# Patient Record
Sex: Female | Born: 1970 | Race: White | Hispanic: No | Marital: Married | Smoking: Never smoker
Health system: Southern US, Community
[De-identification: ages and names within clinical notes are randomized; demographics above are authoritative.]

## PROBLEM LIST (undated history)

## (undated) DIAGNOSIS — M722 Plantar fascial fibromatosis: Secondary | ICD-10-CM

## (undated) DIAGNOSIS — N83209 Unspecified ovarian cyst, unspecified side: Secondary | ICD-10-CM

## (undated) HISTORY — PX: ENDOMETRIAL ABLATION: SHX621

---

## 1999-09-15 ENCOUNTER — Other Ambulatory Visit: Admission: RE | Admit: 1999-09-15 | Discharge: 1999-09-15 | Payer: Self-pay | Admitting: Obstetrics and Gynecology

## 2000-10-11 ENCOUNTER — Other Ambulatory Visit: Admission: RE | Admit: 2000-10-11 | Discharge: 2000-10-11 | Payer: Self-pay | Admitting: Obstetrics and Gynecology

## 2001-02-14 ENCOUNTER — Encounter: Payer: Self-pay | Admitting: Emergency Medicine

## 2001-02-14 ENCOUNTER — Emergency Department (HOSPITAL_COMMUNITY): Admission: EM | Admit: 2001-02-14 | Discharge: 2001-02-14 | Payer: Self-pay | Admitting: Emergency Medicine

## 2001-03-29 ENCOUNTER — Other Ambulatory Visit: Admission: RE | Admit: 2001-03-29 | Discharge: 2001-03-29 | Payer: Self-pay | Admitting: Obstetrics and Gynecology

## 2001-09-24 ENCOUNTER — Inpatient Hospital Stay (HOSPITAL_COMMUNITY): Admission: AD | Admit: 2001-09-24 | Discharge: 2001-09-24 | Payer: Self-pay | Admitting: Obstetrics & Gynecology

## 2001-10-19 ENCOUNTER — Inpatient Hospital Stay (HOSPITAL_COMMUNITY): Admission: AD | Admit: 2001-10-19 | Discharge: 2001-10-22 | Payer: Self-pay | Admitting: Obstetrics and Gynecology

## 2001-11-29 ENCOUNTER — Other Ambulatory Visit: Admission: RE | Admit: 2001-11-29 | Discharge: 2001-11-29 | Payer: Self-pay | Admitting: Obstetrics and Gynecology

## 2003-08-18 ENCOUNTER — Other Ambulatory Visit: Admission: RE | Admit: 2003-08-18 | Discharge: 2003-08-18 | Payer: Self-pay | Admitting: Obstetrics and Gynecology

## 2004-11-01 ENCOUNTER — Other Ambulatory Visit: Admission: RE | Admit: 2004-11-01 | Discharge: 2004-11-01 | Payer: Self-pay | Admitting: *Deleted

## 2004-11-23 ENCOUNTER — Ambulatory Visit (HOSPITAL_COMMUNITY): Admission: RE | Admit: 2004-11-23 | Discharge: 2004-11-23 | Payer: Self-pay | Admitting: Obstetrics and Gynecology

## 2006-05-02 ENCOUNTER — Ambulatory Visit (HOSPITAL_COMMUNITY): Admission: RE | Admit: 2006-05-02 | Discharge: 2006-05-02 | Payer: Self-pay | Admitting: Family Medicine

## 2006-05-22 ENCOUNTER — Ambulatory Visit (HOSPITAL_COMMUNITY): Admission: RE | Admit: 2006-05-22 | Discharge: 2006-05-22 | Payer: Self-pay | Admitting: Family Medicine

## 2007-12-29 IMAGING — CT CT ABDOMEN W/O CM
2 of 3 series · 17 of 46 positions shown, 19 images · IV contrast (agent unspecified)
Comparison: None.

CLINICAL DATA: Worsening left flank pain and hematuria for the past 3 days.

ABDOMEN CT WITHOUT CONTRAST:
TECHNIQUE: Helical transaxial images of the abdomen and pelvis were obtained
without intravenous or oral contrast.

[Series 3: stone proto 5.0 b31f · axial · 0.59mm/px · z∈[-423,-88]mm · 14 of 79 slices shown, 16 images]
[im 6/79  soft-tissue]
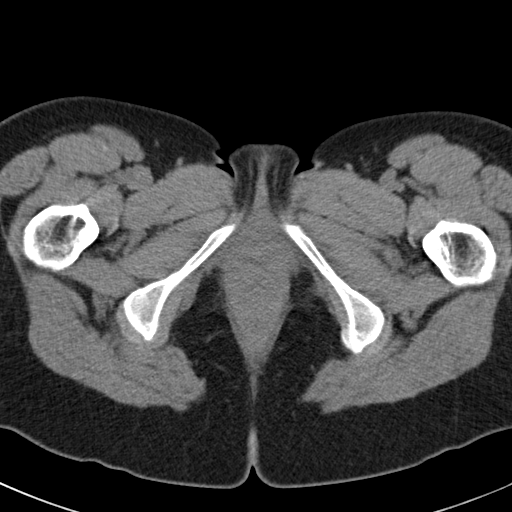
[im 6/79  bone]
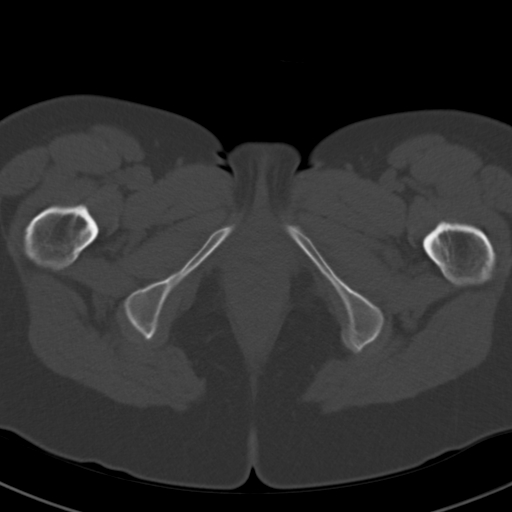
[im 11/79  soft-tissue]
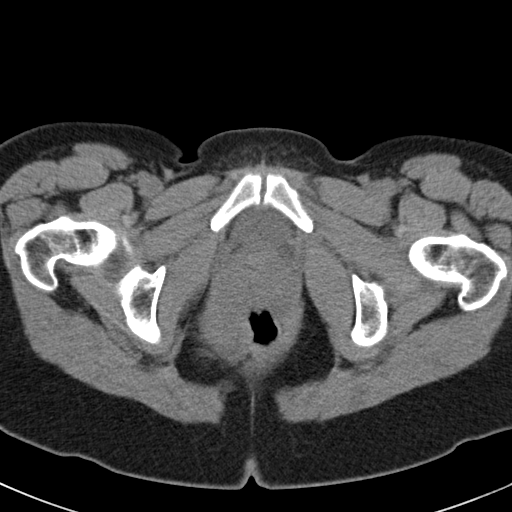
[im 16/79  soft-tissue]
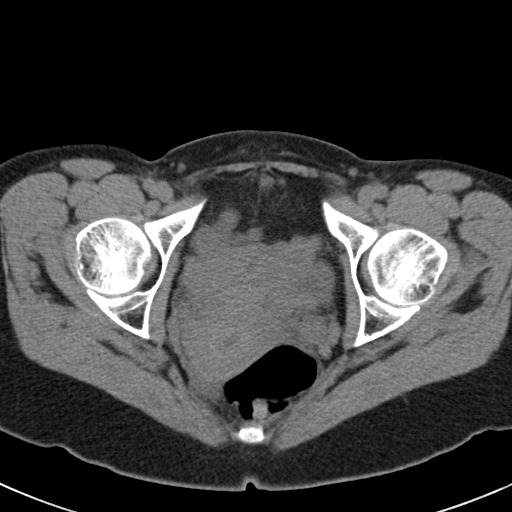
[im 21/79  soft-tissue]
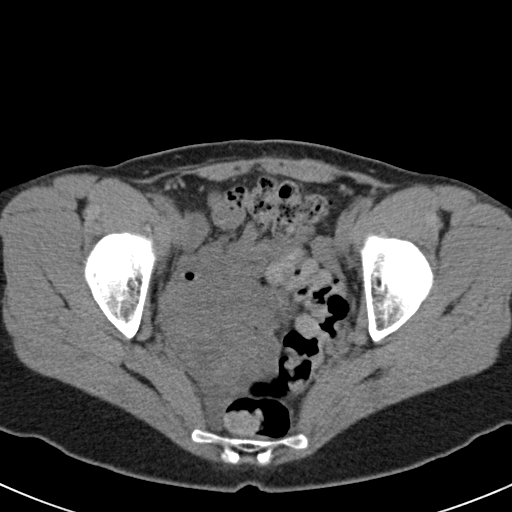
[im 26/79  soft-tissue]
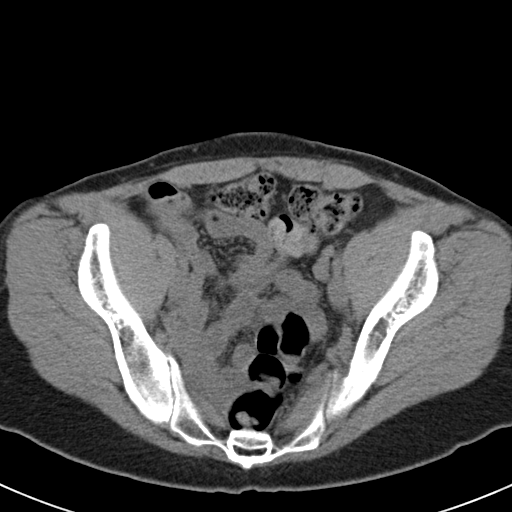
[im 31/79  soft-tissue]
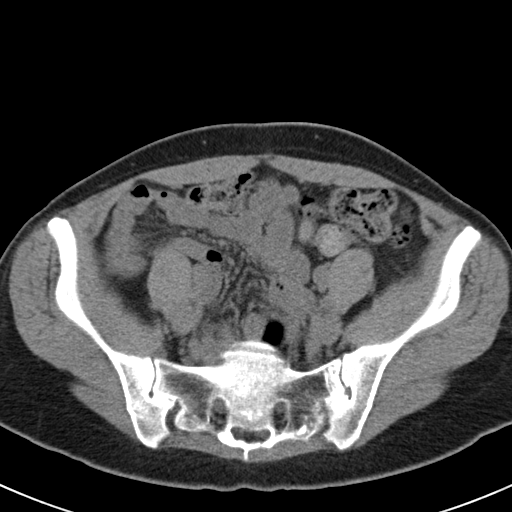
[im 36/79  soft-tissue]
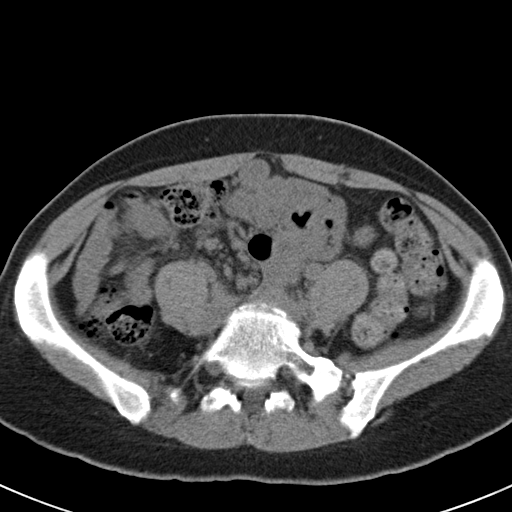
[im 43/79  soft-tissue]
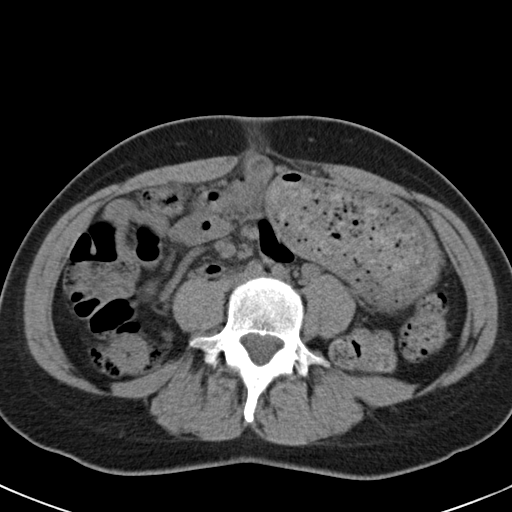
[im 48/79  soft-tissue]
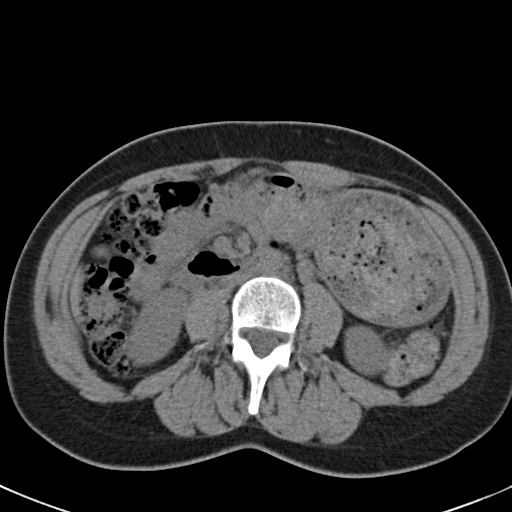
[im 48/79  bone]
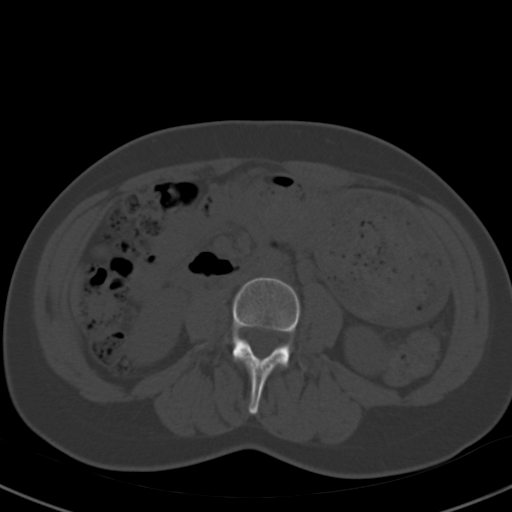
[im 53/79  soft-tissue]
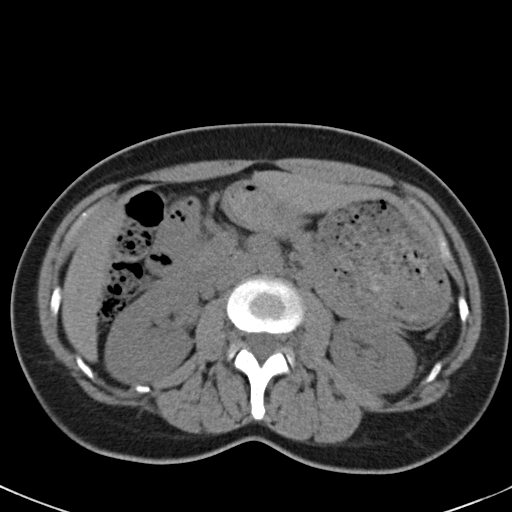
[im 58/79  soft-tissue]
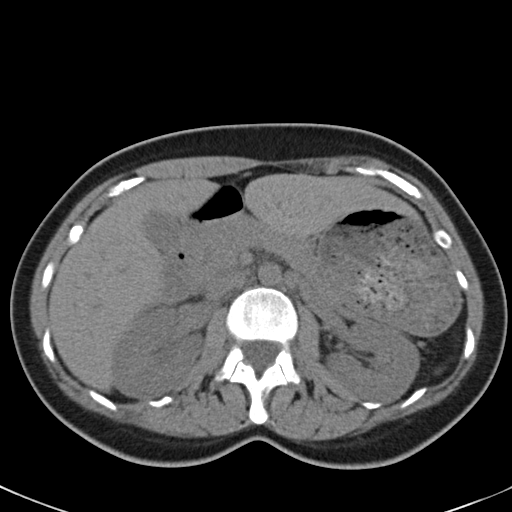
[im 63/79  soft-tissue]
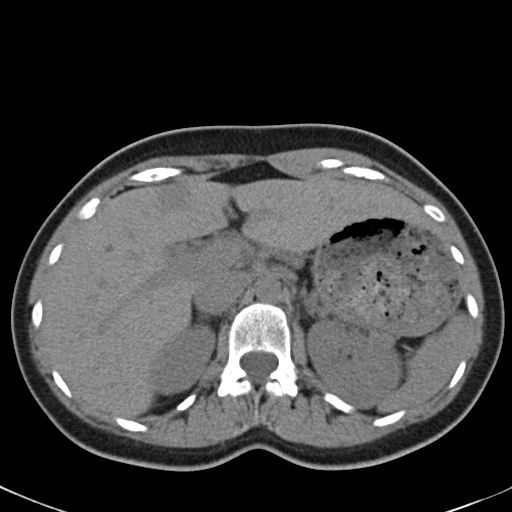
[im 68/79  soft-tissue]
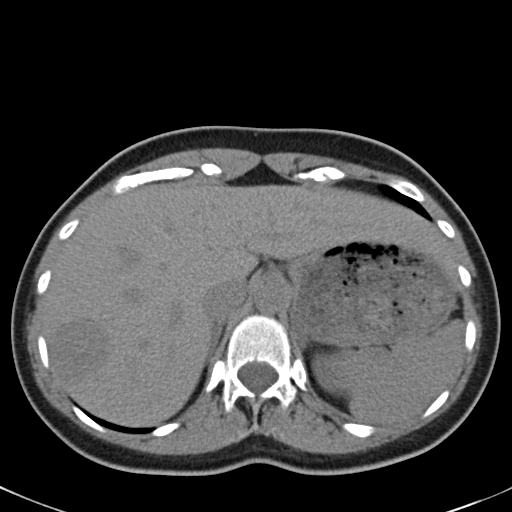
[im 73/79  soft-tissue]
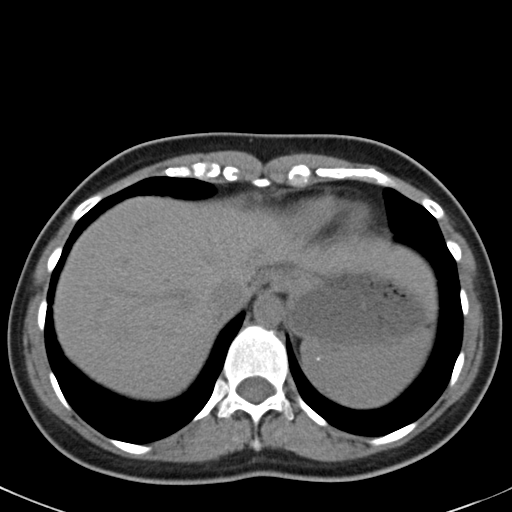

[Series 602: coronals · coronal · 0.77mm/px · 3 of 103 slices shown]
[im 35/103  soft-tissue]
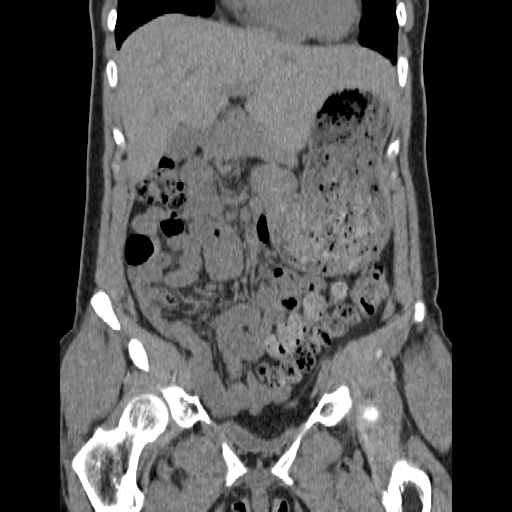
[im 46/103  soft-tissue]
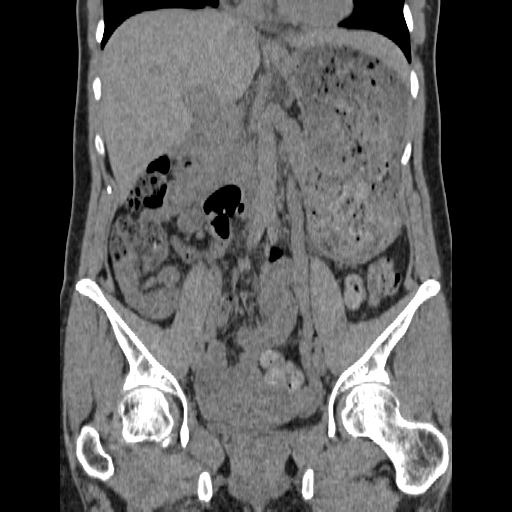
[im 57/103  soft-tissue]
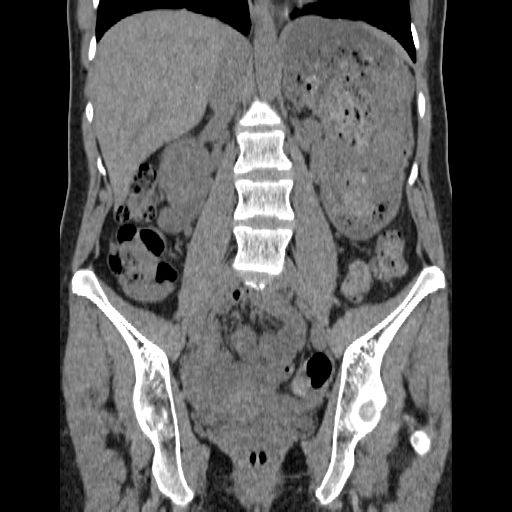

[17 of 46 positions shown; findings below may reference images not displayed]

FINDINGS: Normal appearing kidneys with no renal or ureteral calculi and no
hydronephrosis. Distended stomach, filled with ingested material.  4.3-3.2 cm
low density right lobe liver mass. This measures 30 Hounsfield units in density.
0.5 x 1.5 cm some appearing mass is seen more superiorly in the right lobe of
the liver. 2.9 x 2.1 cm similar appearing mass is seen in the anterior aspect of
the medial segment of the left lobe of the liver. Mild levoconvex lumbar
scoliosis.
IMPRESSION: 1. Three liver masses, as described above. These have nonspecific noncontrast CT
features. Further evaluation with pre-and-post contrast magnetic resonance
imaging is recommended.

2. No acute abnormality.

3. No urinary tract calculi seen. 

PELVIS CT WITHOUT CONTRAST:
FINDINGS: Small amount of free peritoneal fluid, within normal limits of
physiological fluid. No bladder or ureteral calculi are seen.
IMPRESSION: No acute abnormality. No urinary tract calculi seen.

## 2011-05-28 ENCOUNTER — Emergency Department: Admit: 2011-05-28 | Discharge: 2011-05-28 | Disposition: A | Discharge status: Home / Self Care | Payer: Self-pay

## 2011-05-28 ENCOUNTER — Emergency Department
Admission: EM | Admit: 2011-05-28 | Discharge: 2011-05-28 | Disposition: A | Discharge status: Home / Self Care | Payer: Self-pay | Source: Home / Self Care | Attending: Family Medicine | Admitting: Family Medicine

## 2011-05-28 DIAGNOSIS — M549 Dorsalgia, unspecified: Secondary | ICD-10-CM

## 2011-05-28 DIAGNOSIS — R319 Hematuria, unspecified: Secondary | ICD-10-CM

## 2011-05-28 HISTORY — DX: Plantar fascial fibromatosis: M72.2

## 2011-05-28 HISTORY — DX: Unspecified ovarian cyst, unspecified side: N83.209

## 2011-05-28 LAB — POCT URINALYSIS DIP (MANUAL ENTRY)
Glucose, UA: NEGATIVE
Nitrite, UA: NEGATIVE
Protein Ur, POC: NEGATIVE
Spec Grav, UA: 1.025 (ref 1.005–1.03)
Urobilinogen, UA: 1 (ref 0–1)

## 2011-05-28 MED ORDER — TRAMADOL HCL 50 MG PO TABS: 50.0000 mg | ORAL_TABLET | Freq: Every evening | ORAL | Status: AC | PRN

## 2011-05-28 NOTE — ED Provider Notes (Signed)
History     CSN: [id number]  Arrival date & time 05/28/11  1020   First MD Initiated Contact with Patient 05/28/11 1034      Chief Complaint  Patient presents with  . Back Pain     HPI Comments: Patient states that yesterday while sitting at her desk she suddenly developed a sharp stabbing pain in her right posterior back, and over the past 24 hours the pain has begun to radiate into her right upper abdomen.  The pain is generally constant, and is worse with movement and inspiration.  She found it necessary to sleep on her left side last night.  No urinary symptoms.  No nausea/vomiting.  No respiratory symptoms, and no recent cough.  No fevers, chills, and sweats.  No rash.  No lower abdominal or pelvic pain.  The pain improved somewhat after taking Ibuprofen 800mg .  At bedtime yesterday she took Flexeril which helped somewhat with sleep. She notes that she no longer has periods (Patient has had an endometrial ablation).  Patient is a 41 y.o. female presenting with back pain. The history is provided by the patient.  Back Pain  This is a new problem. The current episode started yesterday. The problem occurs constantly. The problem has been gradually worsening. The pain is associated with no known injury. Pain location: right mid-lower back. The quality of the pain is described as stabbing. Radiates to: right upper abdomen. The pain is at a severity of 9/10. The pain is moderate. The symptoms are aggravated by bending, twisting and certain positions. The pain is worse during the day. Associated symptoms include abdominal pain. Pertinent negatives include no chest pain, no fever, no numbness, no weight loss, no headaches, no abdominal swelling, no bowel incontinence, no perianal numbness, no bladder incontinence, no dysuria, no pelvic pain, no leg pain, no paresthesias, no paresis, no tingling and no weakness. She has tried NSAIDs and muscle relaxants ("Icy Hot") for the symptoms. The treatment  provided mild relief.    Past Medical History  Diagnosis Date  . Ovarian cyst   . Plantar fasciitis, left     Past Surgical History  Procedure Date  . Endometrial ablation   . Cesarean section     Family History  Problem Relation Age of Onset  . Urolithiasis Father     History  Substance Use Topics  . Smoking status: Never Smoker   . Smokeless tobacco: Not on file  . Alcohol Use: Yes    OB History    Grav Para Term Preterm Abortions TAB SAB Ect Mult Living                  Review of Systems  Constitutional: Negative for fever and weight loss.  Cardiovascular: Negative for chest pain.  Gastrointestinal: Positive for abdominal pain. Negative for bowel incontinence.  Genitourinary: Negative for bladder incontinence, dysuria and pelvic pain.  Musculoskeletal: Positive for back pain.  Neurological: Negative for tingling, weakness, numbness, headaches and paresthesias.  All other systems reviewed and are negative.    Allergies  Review of patient's allergies indicates no known allergies.  Home Medications   Current Outpatient Rx  Name Route Sig Dispense Refill  . ALPRAZOLAM 0.5 MG PO TABS Oral Take 0.5 mg by mouth at bedtime as needed.    Marland Kitchen FLUTICASONE PROPIONATE 50 MCG/ACT NA SUSP Nasal Place 2 sprays into the nose daily.    . IBUPROFEN 800 MG PO TABS Oral Take 800 mg by mouth every 8 (eight) hours  as needed.    Marland Kitchen ONE-DAILY MULTI VITAMINS PO TABS Oral Take 1 tablet by mouth daily.    Marland Kitchen NAPROXEN SODIUM 220 MG PO TABS Oral Take 220 mg by mouth 2 (two) times daily with a meal.    . TRAMADOL HCL 50 MG PO TABS Oral Take 1 tablet (50 mg total) by mouth at bedtime as needed for pain. Take one or two tabs at bedtime as needed for pain Maximum dose= 8 tablets per day 15 tablet 0    BP 105/68  Pulse 80  Temp(Src) 98.3 F (36.8 C) (Oral)  Resp 18  Ht 5\' 6"  (1.676 m)  Wt 143 lb 4 oz (64.978 kg)  BMI 23.12 kg/m2  SpO2 99%  Physical Exam Nursing notes and Vital  Signs reviewed. Appearance:  Patient appears healthy, stated age, and in no acute distress Eyes:  Pupils are equal, round, and reactive to light and accomodation.  Extraocular movement is intact.  Conjunctivae are not inflamed   Pharynx:  Normal Neck:  Supple.  No adenopathy Lungs:  Clear to auscultation.  Breath sounds are equal.  Heart:  Regular rate and rhythm without murmurs, rubs, or gallops.  Abdomen:  Nontender without masses or hepatosplenomegaly.  Bowel sounds are present.  No CVA or flank tenderness.  Extremities:  No edema.  No calf tenderness.  No tenderness over right greater trochanter.  External rotation of the right hip while abducting recreates pain in her right SI joint area. Skin:  No rash present.   Back:   Can heel/toe walk and squat without difficulty.  There is mild tenderness over the right SI joint, and no tenderness in the right paraspinous muscles where patient experiences her pain.   Straight leg raising test is negative.  Sitting knee extension test is negative.  Strength and sensation in the lower extremities is normal.  Patellar and achilles reflexes are normal  ED Course  Procedures  none   Labs Reviewed  POCT URINALYSIS DIP (MANUAL ENTRY) Large blood, otherwise negative     URINE CULTURE pending   Dg Lumbar Spine 2-3 Views  05/28/2011  *RADIOLOGY REPORT*  Clinical Data: Lower back pain.  No known injury.  LUMBAR SPINE - 2-3 VIEW  Comparison: CT of the abdomen and pelvis 05/02/2006  Findings: There is rotoscoliosis of the lower lumbar spine, convex to the left.  No evidence for acute fracture.  No lytic or blastic lesions identified.  Regional bowel gas pattern is nonobstructive.  IMPRESSION:  1. No evidence for acute  abnormality. 2.  Rotoscoliosis of the lower lumbar spine.  Original Report Authenticated By: Patterson Hammersmith, M.D.     1. Back pain; ? nephrolithiasis, although she is not having any urinary symptoms  2. Hematuria   Patient's chart  reviewed:  On 05/22/2006 she had an MR abdomen w/wo contrast because of left flank pain and hematuria.  The scan was unremarkable except for several hemangiomas within the liver, and also had a 1cm cystic structure seen in the left neuroforaminal regionof T8-9.  Her exam is most consistent with SI joint inflammation on the right.  Must also consider possibility of early herpes zoster.  If right abdominal pain persists must consider possibility of progression of the hepatic hemangiomas previously noted.    MDM  For now will treat as likely nephrolithiasis.  Advised her to increase fluid intake.  Continue Ibuprofen 800mg  every 8 hours. Rx for Tramadol at bedtime.  Strain urine (will need to submit for analysis  if passes stone) Advised to call if dermatomal rash develops (start Valtrex) She should follow-up with her PCP if not improving within  Days.        Lattie Haw, MD 05/28/11 [phone]

## 2011-05-28 NOTE — Discharge Instructions (Signed)
Increase fluid intake.  Continue Ibuprofen 800mg  every 8 hours with food. If symptoms become significantly worse during the night or over the weekend, proceed to the local emergency room.  Call if rash develops

## 2011-05-28 NOTE — ED Notes (Signed)
States right sided back pain started yesterday, have use heat/ice, bio-freeze and flexeril w/out much relief.

## 2011-05-30 LAB — URINE CULTURE

## 2011-05-31 ENCOUNTER — Telehealth: Payer: Self-pay

## 2011-05-31 NOTE — ED Notes (Signed)
I spoke with patient and she states her back pain is better. I advised her to call if any questions or concerns.

## 2013-01-23 IMAGING — CR DG LUMBAR SPINE 2-3V
3 series · 3 of 3 positions shown · non-contrast
Comparison: CT of the abdomen and pelvis 05/02/2006

CLINICAL DATA: Lower back pain.  No known injury.

LUMBAR SPINE - 2-3 VIEW

[view not recorded (1 of 3)]
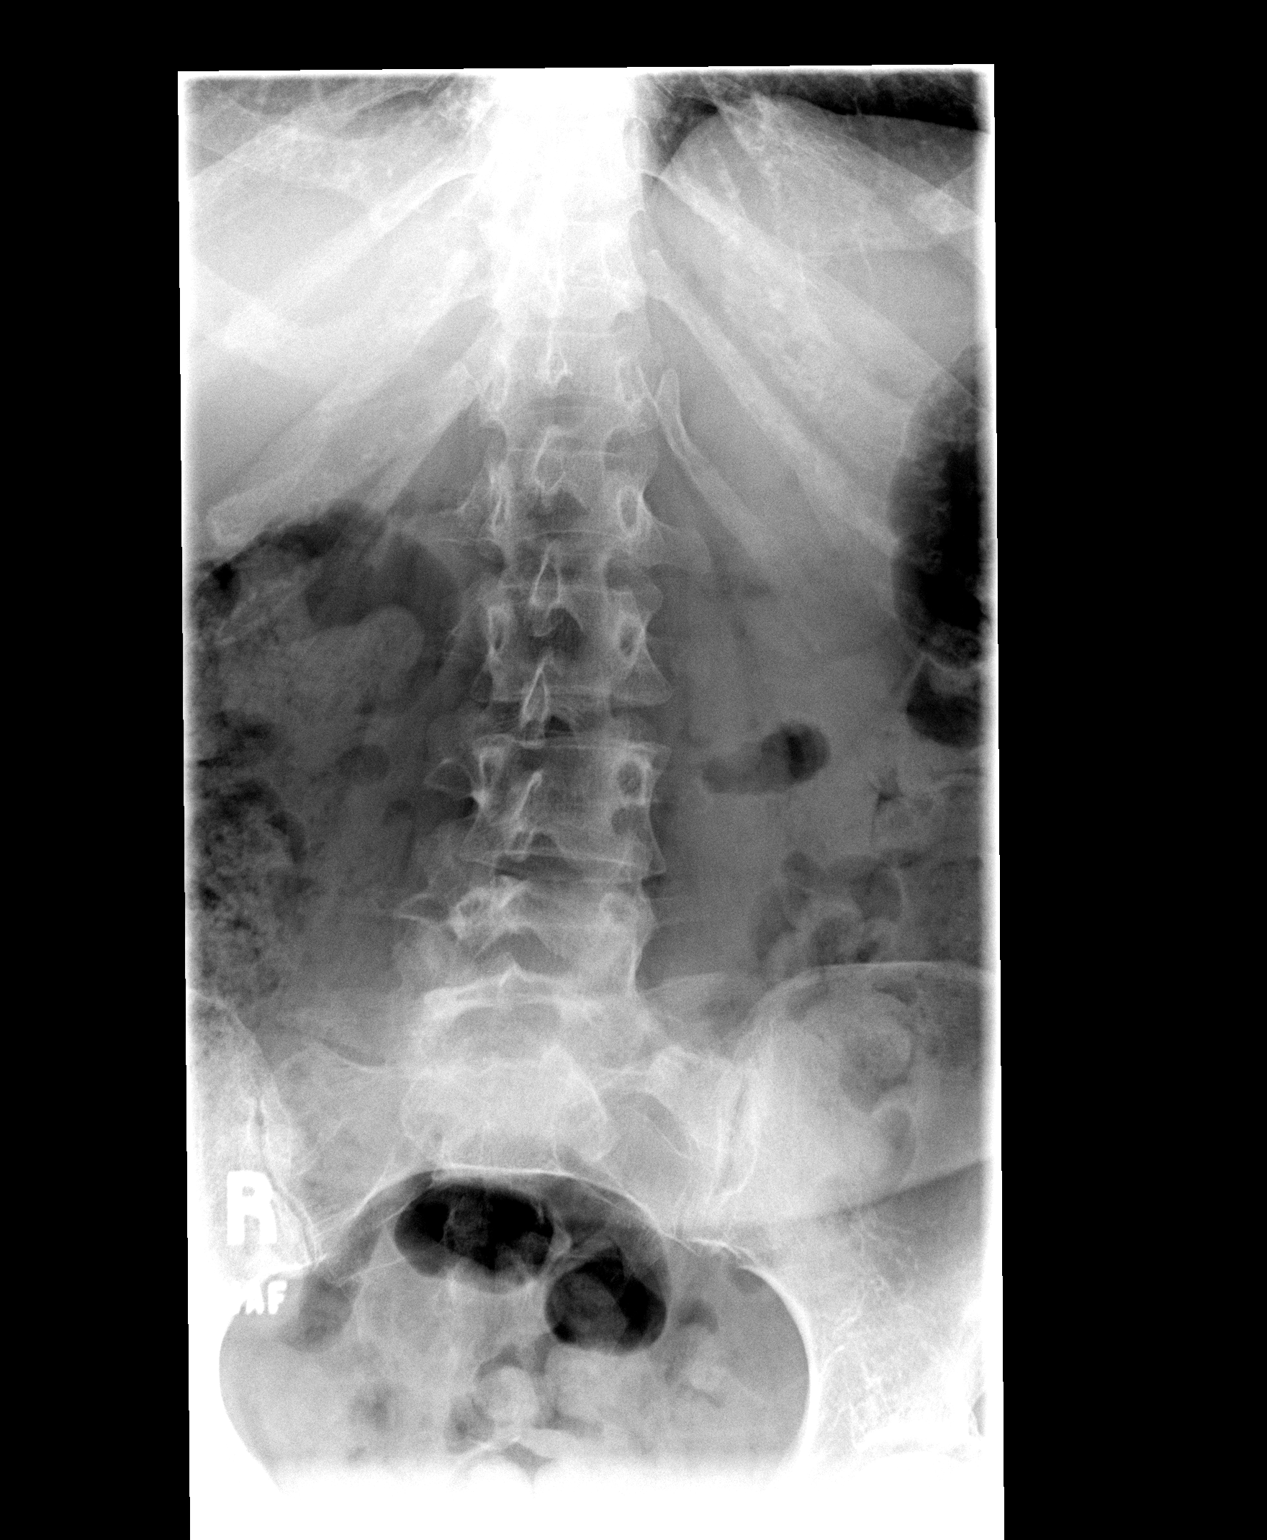

[view not recorded (2 of 3)]
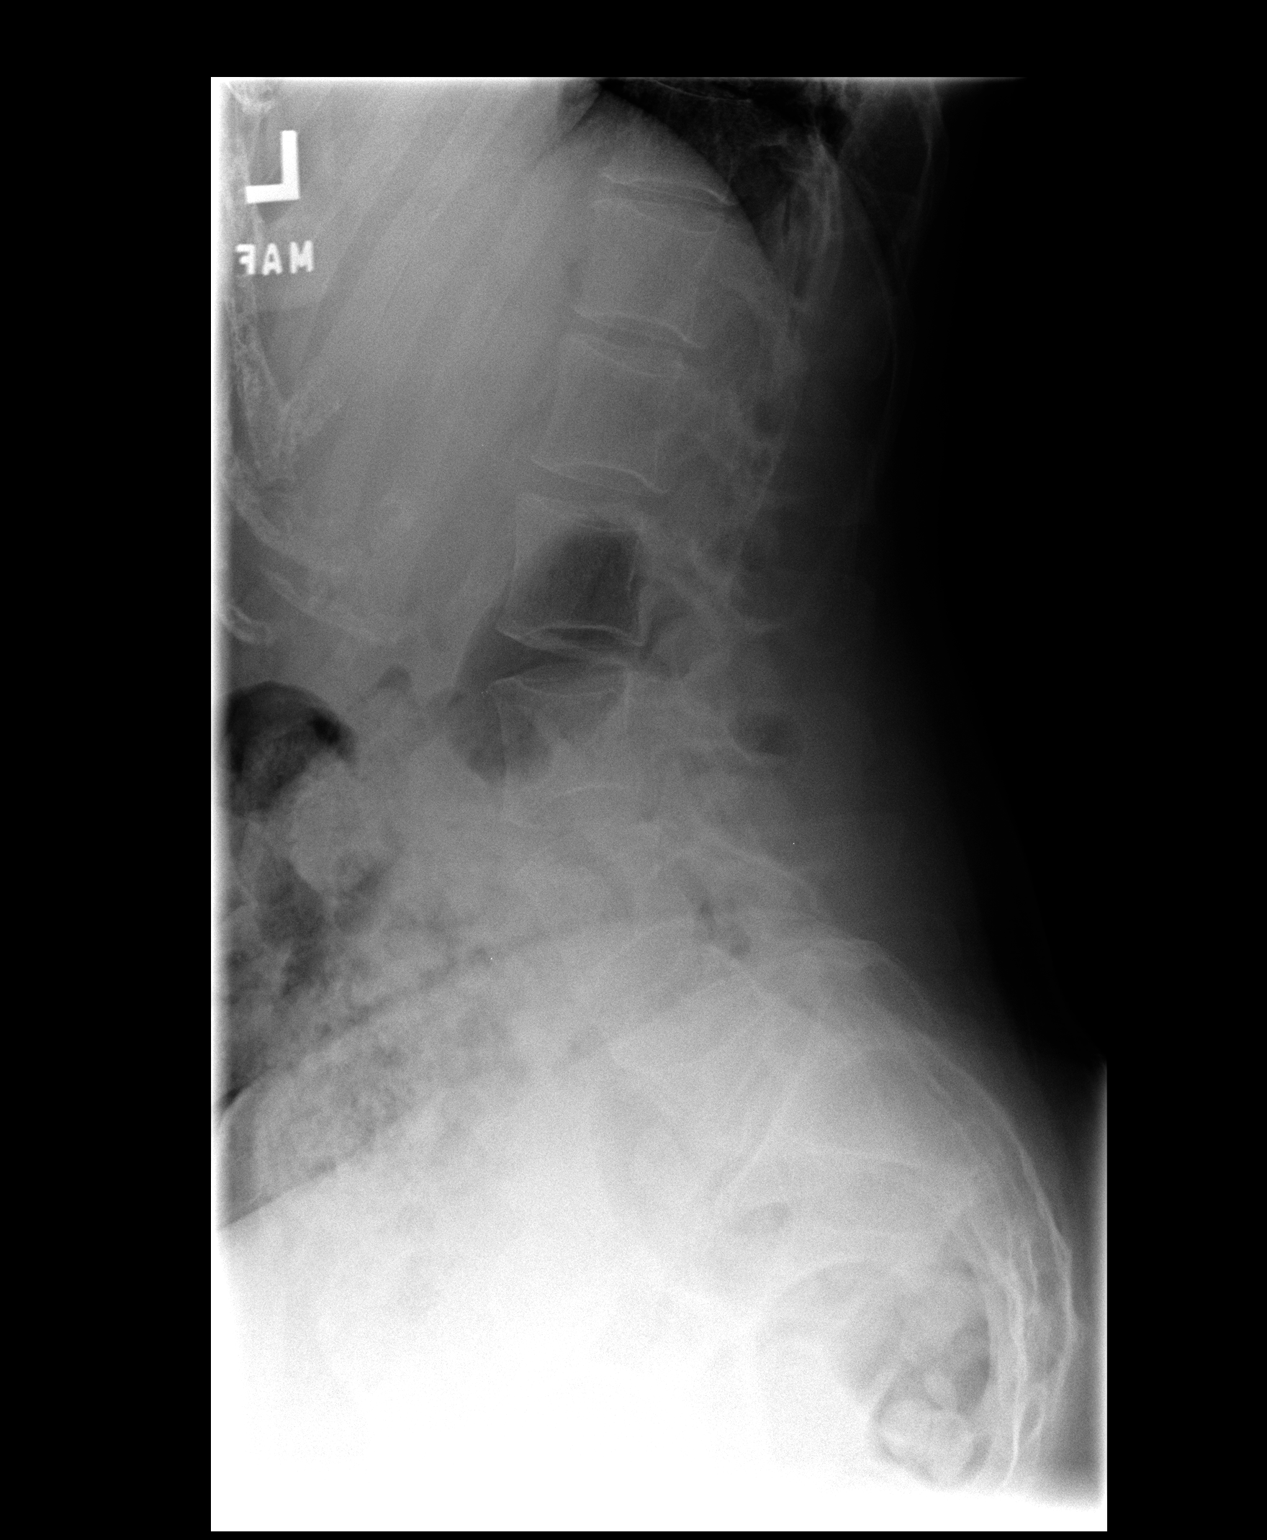

[view not recorded (3 of 3)]
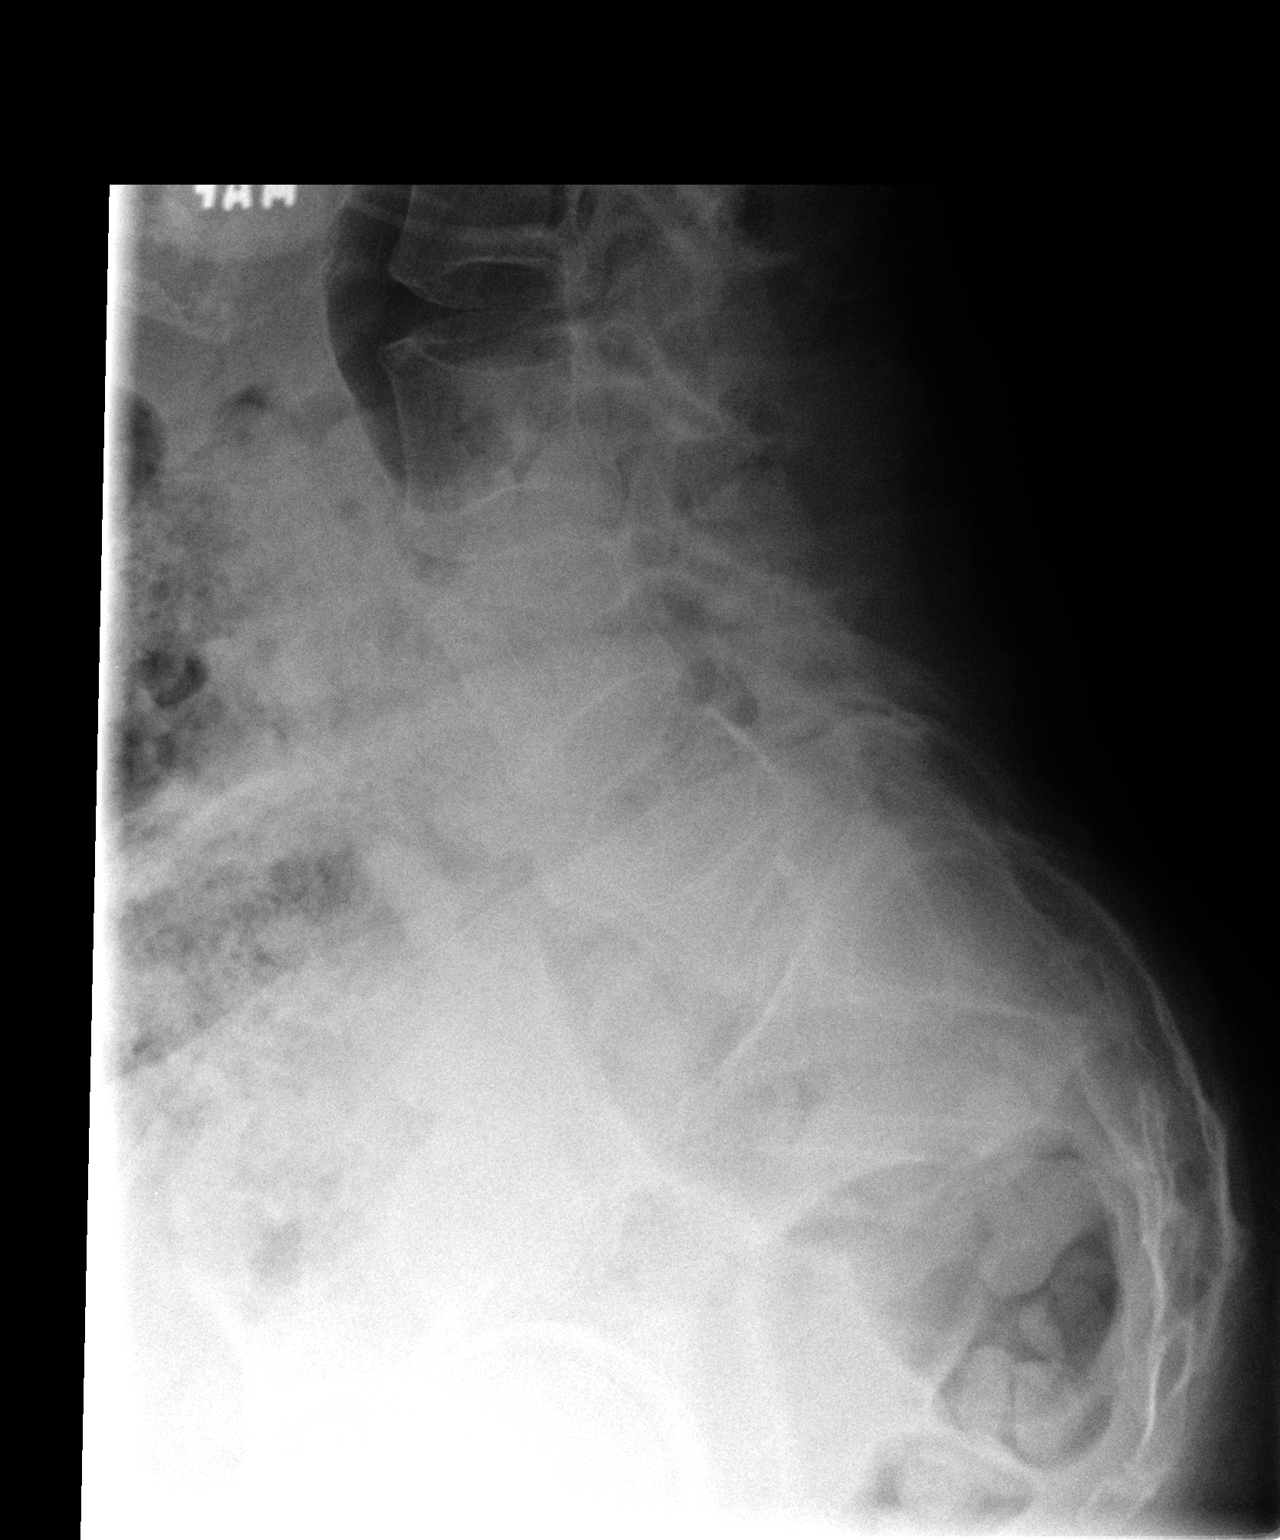

[3 of 3 positions shown; findings below may reference images not displayed]

FINDINGS: There is rotoscoliosis of the lower lumbar spine, convex
to the left.  No evidence for acute fracture.  No lytic or blastic
lesions identified.  Regional bowel gas pattern is nonobstructive.
IMPRESSION: 1. No evidence for acute  abnormality.
2.  Rotoscoliosis of the lower lumbar spine.

## 2014-01-28 ENCOUNTER — Other Ambulatory Visit: Payer: Self-pay | Admitting: Obstetrics and Gynecology

## 2014-01-29 LAB — CYTOLOGY - PAP

## 2015-09-02 ENCOUNTER — Encounter: Payer: Self-pay | Admitting: Podiatry

## 2015-09-02 ENCOUNTER — Ambulatory Visit: Payer: Self-pay | Admitting: Podiatry

## 2015-09-02 ENCOUNTER — Ambulatory Visit (INDEPENDENT_AMBULATORY_CARE_PROVIDER_SITE_OTHER): Payer: Self-pay | Admitting: Podiatry

## 2015-09-02 DIAGNOSIS — S92351A Displaced fracture of fifth metatarsal bone, right foot, initial encounter for closed fracture: Secondary | ICD-10-CM

## 2015-09-02 DIAGNOSIS — S99199A Other physeal fracture of unspecified metatarsal, initial encounter for closed fracture: Secondary | ICD-10-CM | POA: Insufficient documentation

## 2015-09-02 DIAGNOSIS — M722 Plantar fascial fibromatosis: Secondary | ICD-10-CM

## 2015-09-02 NOTE — Patient Instructions (Signed)
Seen for injured right foot. X-ray show bone healing in progress. Ankle brace dispensed. Need orthotics for left plantar fasciitis.

## 2015-09-02 NOTE — Progress Notes (Signed)
SUBJECTIVE: 45 y.o. year old female presents wearing short leg CAM walker stating that she need follow up on broken bone on right foot.  Diagnosed with Pseudo Jones fracture right foot 6 weeks ago(07/20/15) while traveling in [place]. Still feel pain and hot at the fracture site and right lateral lower limb.  Old X-ray reviewed through her cell phone that indicated Metatarsal base fracture.  She also has left heel pain, chronic, treated with Orthotics.  REVIEW OF SYSTEMS: A comprehensive review of systems was negative.  OBJECTIVE: DERMATOLOGIC EXAMINATION: No abnormal skin lesions.  VASCULAR EXAMINATION OF LOWER LIMBS: Pedal pulses: All pedal pulses are palpable with normal pulsation.  Temperature gradient from tibial crest to dorsum of foot is within normal bilateral.  NEUROLOGIC EXAMINATION OF THE LOWER LIMBS: All epicritic and tactile sensations grossly intact.   MUSCULOSKELETAL EXAMINATION: Positive for high arched cavus foot bilateral. Right foot limited eversion and inversion due to injury.  RADIOGRAPHIC STUDIES:  General overview: AP View:  Positive of obliterated fracture line with trabecular patterns going across fracture site. Minimum bone callus.  Lateral view:  Supinated foot with diminished fracture line at 5th metatarsal base.   ASSESSMENT: Healing Susan Mckee' fracture right 5th Metatarsal base.  Pes Cavus. Plantar fasciitis left, chronic.  PLAN: Reviewed clinical findings and available treatment options. Ankle brace dispensed. To use for the next 3 weeks. Return for custom orthotics for heel pain.

## 2019-02-14 ENCOUNTER — Ambulatory Visit: Payer: Self-pay | Attending: Internal Medicine

## 2019-02-14 DIAGNOSIS — Z20822 Contact with and (suspected) exposure to covid-19: Secondary | ICD-10-CM | POA: Insufficient documentation

## 2019-02-16 LAB — NOVEL CORONAVIRUS, NAA: SARS-CoV-2, NAA: NOT DETECTED

## 2021-11-17 ENCOUNTER — Other Ambulatory Visit: Payer: Self-pay | Admitting: Obstetrics and Gynecology

## 2021-11-17 DIAGNOSIS — N6323 Unspecified lump in the left breast, lower outer quadrant: Secondary | ICD-10-CM

## 2021-12-23 ENCOUNTER — Other Ambulatory Visit: Payer: Self-pay

## 2022-01-21 ENCOUNTER — Other Ambulatory Visit: Payer: Self-pay | Admitting: Obstetrics and Gynecology

## 2022-01-21 ENCOUNTER — Ambulatory Visit
Admission: RE | Admit: 2022-01-21 | Discharge: 2022-01-21 | Disposition: A | Discharge status: Home / Self Care | Payer: No Typology Code available for payment source | Source: Ambulatory Visit | Attending: Obstetrics and Gynecology | Admitting: Obstetrics and Gynecology

## 2022-01-21 ENCOUNTER — Ambulatory Visit
Admission: RE | Admit: 2022-01-21 | Discharge: 2022-01-21 | Disposition: A | Discharge status: Home / Self Care | Payer: Self-pay | Source: Ambulatory Visit | Attending: Obstetrics and Gynecology | Admitting: Obstetrics and Gynecology

## 2022-01-21 DIAGNOSIS — N6310 Unspecified lump in the right breast, unspecified quadrant: Secondary | ICD-10-CM

## 2022-01-21 DIAGNOSIS — N6323 Unspecified lump in the left breast, lower outer quadrant: Secondary | ICD-10-CM

## 2022-07-25 ENCOUNTER — Other Ambulatory Visit: Payer: No Typology Code available for payment source

## 2022-08-01 ENCOUNTER — Other Ambulatory Visit: Payer: No Typology Code available for payment source

## 2022-08-22 ENCOUNTER — Other Ambulatory Visit: Payer: Self-pay | Admitting: Obstetrics and Gynecology

## 2022-08-22 ENCOUNTER — Encounter: Payer: Self-pay | Admitting: Obstetrics and Gynecology

## 2022-08-22 ENCOUNTER — Ambulatory Visit
Admission: RE | Admit: 2022-08-22 | Discharge: 2022-08-22 | Disposition: A | Discharge status: Home / Self Care | Payer: No Typology Code available for payment source | Source: Ambulatory Visit | Attending: Obstetrics and Gynecology | Admitting: Obstetrics and Gynecology

## 2022-08-22 DIAGNOSIS — N6310 Unspecified lump in the right breast, unspecified quadrant: Secondary | ICD-10-CM

## 2023-01-24 ENCOUNTER — Ambulatory Visit
Admission: RE | Admit: 2023-01-24 | Discharge: 2023-01-24 | Disposition: A | Discharge status: Home / Self Care | Payer: No Typology Code available for payment source | Source: Ambulatory Visit | Attending: Obstetrics and Gynecology | Admitting: Obstetrics and Gynecology

## 2023-01-24 DIAGNOSIS — N6310 Unspecified lump in the right breast, unspecified quadrant: Secondary | ICD-10-CM

## 2024-02-29 ENCOUNTER — Other Ambulatory Visit: Payer: Self-pay | Admitting: Obstetrics and Gynecology

## 2024-02-29 DIAGNOSIS — D241 Benign neoplasm of right breast: Secondary | ICD-10-CM

## 2024-02-29 DIAGNOSIS — N644 Mastodynia: Secondary | ICD-10-CM

## 2024-03-15 ENCOUNTER — Other Ambulatory Visit: Payer: Self-pay | Admitting: Obstetrics and Gynecology

## 2024-03-15 ENCOUNTER — Encounter: Payer: Self-pay | Admitting: Obstetrics and Gynecology

## 2024-03-15 DIAGNOSIS — D241 Benign neoplasm of right breast: Secondary | ICD-10-CM

## 2024-03-15 DIAGNOSIS — N644 Mastodynia: Secondary | ICD-10-CM

## 2024-03-22 ENCOUNTER — Other Ambulatory Visit: Payer: Self-pay
